# Patient Record
Sex: Female | Born: 1965 | Race: White | Hispanic: No | Marital: Single | Smoking: Never smoker
Health system: Southern US, Community
[De-identification: ages and names within clinical notes are randomized; demographics above are authoritative.]

## PROBLEM LIST (undated history)

## (undated) DIAGNOSIS — J302 Other seasonal allergic rhinitis: Secondary | ICD-10-CM

## (undated) HISTORY — PX: BREAST CYST ASPIRATION: SHX578

---

## 2005-06-07 ENCOUNTER — Ambulatory Visit: Payer: Self-pay | Admitting: Internal Medicine

## 2005-06-09 ENCOUNTER — Ambulatory Visit: Payer: Self-pay | Admitting: Internal Medicine

## 2006-03-30 ENCOUNTER — Ambulatory Visit: Payer: Self-pay | Admitting: Unknown Physician Specialty

## 2007-07-17 ENCOUNTER — Ambulatory Visit: Payer: Self-pay | Admitting: Unknown Physician Specialty

## 2007-07-26 ENCOUNTER — Ambulatory Visit: Payer: Self-pay | Admitting: Unknown Physician Specialty

## 2008-10-28 ENCOUNTER — Ambulatory Visit: Payer: Self-pay

## 2010-01-20 ENCOUNTER — Ambulatory Visit: Payer: Self-pay

## 2011-08-22 ENCOUNTER — Ambulatory Visit: Payer: Self-pay | Admitting: Family Medicine

## 2011-12-05 ENCOUNTER — Ambulatory Visit: Payer: Self-pay | Admitting: Obstetrics and Gynecology

## 2011-12-09 ENCOUNTER — Ambulatory Visit: Payer: Self-pay | Admitting: Obstetrics and Gynecology

## 2012-10-17 ENCOUNTER — Ambulatory Visit: Payer: Self-pay | Admitting: Nurse Practitioner

## 2013-03-11 ENCOUNTER — Ambulatory Visit: Payer: Self-pay | Admitting: Physician Assistant

## 2013-10-18 ENCOUNTER — Ambulatory Visit: Payer: Self-pay | Admitting: Nurse Practitioner

## 2014-11-18 ENCOUNTER — Ambulatory Visit: Payer: Self-pay | Admitting: Nurse Practitioner

## 2015-10-27 ENCOUNTER — Other Ambulatory Visit: Payer: Self-pay | Admitting: Nurse Practitioner

## 2015-10-27 DIAGNOSIS — Z1231 Encounter for screening mammogram for malignant neoplasm of breast: Secondary | ICD-10-CM

## 2015-11-26 ENCOUNTER — Ambulatory Visit
Admission: RE | Admit: 2015-11-26 | Discharge: 2015-11-26 | Disposition: A | Discharge status: Home / Self Care | Payer: Managed Care, Other (non HMO) | Source: Ambulatory Visit | Attending: Nurse Practitioner | Admitting: Nurse Practitioner

## 2015-11-26 DIAGNOSIS — Z1231 Encounter for screening mammogram for malignant neoplasm of breast: Secondary | ICD-10-CM | POA: Diagnosis not present

## 2015-11-30 ENCOUNTER — Other Ambulatory Visit: Payer: Self-pay | Admitting: Nurse Practitioner

## 2015-11-30 DIAGNOSIS — N63 Unspecified lump in unspecified breast: Secondary | ICD-10-CM

## 2015-12-04 ENCOUNTER — Ambulatory Visit
Admission: RE | Admit: 2015-12-04 | Discharge: 2015-12-04 | Disposition: A | Discharge status: Home / Self Care | Payer: Managed Care, Other (non HMO) | Source: Ambulatory Visit | Attending: Nurse Practitioner | Admitting: Nurse Practitioner

## 2015-12-04 ENCOUNTER — Ambulatory Visit: Payer: Managed Care, Other (non HMO)

## 2015-12-04 DIAGNOSIS — N6002 Solitary cyst of left breast: Secondary | ICD-10-CM | POA: Diagnosis not present

## 2015-12-04 DIAGNOSIS — N63 Unspecified lump in unspecified breast: Secondary | ICD-10-CM

## 2016-05-22 ENCOUNTER — Encounter: Payer: Self-pay | Admitting: Emergency Medicine

## 2016-05-22 ENCOUNTER — Ambulatory Visit
Admission: EM | Admit: 2016-05-22 | Discharge: 2016-05-22 | Disposition: A | Discharge status: Home / Self Care | Payer: Managed Care, Other (non HMO) | Attending: Family Medicine | Admitting: Family Medicine

## 2016-05-22 DIAGNOSIS — J309 Allergic rhinitis, unspecified: Secondary | ICD-10-CM | POA: Diagnosis not present

## 2016-05-22 HISTORY — DX: Other seasonal allergic rhinitis: J30.2

## 2016-05-22 MED ORDER — GUAIFENESIN-CODEINE 100-10 MG/5ML PO SOLN: 5.0000 mL | Freq: Every evening | ORAL | Status: DC | PRN

## 2016-05-22 MED ORDER — BENZONATATE 100 MG PO CAPS: 100.0000 mg | ORAL_CAPSULE | Freq: Three times a day (TID) | ORAL | Status: DC | PRN

## 2016-05-22 NOTE — ED Notes (Signed)
Patient c/o sinus congestion and pressure, HA, and nasal congestion that started yesterday.  Patient denies fevers.

## 2016-05-22 NOTE — ED Provider Notes (Signed)
[facility]  ____________________________________________  Time seen: Approximately 11:30 AM  I have reviewed the triage vital signs and the nursing notes.   HISTORY  Chief Complaint Facial Pain and Nasal Congestion  HPI Angela Lee is a 50 y.o. femalepresents for the complaints of 1 day of runny nose, nasal congestion, sinus pressure and postnasal drainage that started yesterday morning. Patient reports frequently blowing her nose and then get and greenish drainage out. Patient reports that she does have a history of seasonal allergies and states that she normally takes Zyrtec and Flonase. Patient states that she always uses her Flonase but doesn't always use her Zyrtec. Patient states 2 days prior to her symptoms she was outside a lot and forgot to use her Flonase and Zyrtec. Patient states she does have a history of seasonal allergies and states that she feels that this flared the sickness up. Patient states that she is concerned she has a sinus infection. Reports has used Zyrtec and Flonase but denies any other additional medications pain use for her symptpms. Patient reports that she can feel postnasal drainage that causes a cough. Patient reports that the cough intermittently woke her up from sleep last night. Patient states occasional scratchy throat but denies sore throat at this time.   Reports continues to eat and drink well. Denies any fevers. Denies chest pain, shortness of breath, abdominal pain, dysuria, neck pain, back pain, extremity pain or extremity swelling.  No LMP recorded. Patient has had an ablation.   Past Medical History  Diagnosis Date  . Seasonal allergies     There are no active problems to display for this patient.   Past Surgical History  Procedure Laterality Date  . Breast cyst aspiration Bilateral     Current Outpatient Rx  Name  Route  Sig  Dispense  Refill  . fluticasone (FLONASE) 50 MCG/ACT nasal spray   Each Nare   Place 1  spray into both nostrils daily.         . Multiple Vitamin (MULTIVITAMIN) tablet   Oral   Take 1 tablet by mouth daily.           Allergies Depo-provera; Imitrex; Naprosyn; Propranolol; and Phentermine  Family History  Problem Relation Age of Onset  . Lung cancer Mother   . Leukemia Father   . Breast cancer Maternal Aunt     Social History Social History  Substance Use Topics  . Smoking status: Never Smoker   . Smokeless tobacco: None  . Alcohol Use: Yes    Review of Systems Constitutional: No fever/chills Eyes: No visual changes. ENT: Positive runny nose, nasal congestion.  Cardiovascular: Denies chest pain. Respiratory: Denies shortness of breath. Gastrointestinal: No abdominal pain.  No nausea, no vomiting.  No diarrhea.  No constipation. Genitourinary: Negative for dysuria. Musculoskeletal: Negative for back pain. Skin: Negative for rash. Neurological: Negative for headaches, focal weakness or numbness.  10-point ROS otherwise negative.  ____________________________________________  PHYSICAL EXAM:  VITAL SIGNS: ED Triage Vitals  Enc Vitals Group     BP 05/22/16 1111 133/86 mmHg     Pulse Rate 05/22/16 1111 78     Resp 05/22/16 1111 16     Temp 05/22/16 1111 98.6 F (37 C)     Temp Source 05/22/16 1111 Tympanic     SpO2 05/22/16 1111 99 %     Weight 05/22/16 1111 146 lb (66.225 kg)     Height 05/22/16 1111 5\' 3"  (1.6 m)     Head  Cir --      Peak Flow --      Pain Score 05/22/16 1117 4     Pain Loc --      Pain Edu? --      Excl. in GC? --    Constitutional: Alert and oriented. Well appearing and in no acute distress. Eyes: Conjunctivae are normal. PERRL. EOMI. Head: Atraumatic. Mild sinus tenderness to palpation. No swelling. No erythema.  Ears: no erythema, normal TMs bilaterally.   Nose:Nasal congestion with clear rhinorrhea  Mouth/Throat: Mucous membranes are moist. No pharyngeal erythema. No tonsillar swelling or exudate.  Neck: No  stridor.  No cervical spine tenderness to palpation. Hematological/Lymphatic/Immunilogical: No cervical lymphadenopathy. Cardiovascular: Normal rate, regular rhythm. Grossly normal heart sounds.  Good peripheral circulation. Respiratory: Normal respiratory effort.  No retractions. Lungs CTAB. No wheezes, rales or rhonchi. Good air movement.  Gastrointestinal: Soft and nontender. Normal Bowel sounds. No CVA tenderness. Musculoskeletal: No lower or upper extremity tenderness nor edema. No cervical, thoracic or lumbar tenderness to palpation. Neurologic:  Normal speech and language. No gross focal neurologic deficits are appreciated. No gait instability. Skin:  Skin is warm, dry and intact. No rash noted. Psychiatric: Mood and affect are normal. Speech and behavior are normal.  ____________________________________________   LABS (all labs ordered are listed, but only abnormal results are displayed)  Labs Reviewed - No data to display  RADIOLOGY  No results found.   INITIAL IMPRESSION / ASSESSMENT AND PLAN / ED COURSE  Pertinent labs & imaging results that were available during my care of the patient were reviewed by me and considered in my medical decision making (see chart for details).  Very well-appearing patient. No acute distress. Presents for the complaints of runny nose, nasal congestion, sinus pressure since yesterday. Reports history of seasonal allergies and was concerned for sinus infection. Well-appearing patient. Lungs clear throughout. Abdomen soft and nontender. Very mild bilateral frontal and maxillary sinus tenderness to palpation with clear nasal congestion. Patient declines strep testing. Suspect allergic rhinitis versus upper respiratory infection. Discussed with patient will treat supportively. Continue home Flonase and Zyrtec and will treat cough with when necessary Tessalon Perles during the day and when necessary guaifenesin with codeine at night as patient reports  that she has tolerated guaifenesin with codeine in the past. Encouraged rest, fluids, avoidance of triggers.Discussed indication, risks and benefits of medications with patient.  Discussed follow up with Primary care physician this week. Discussed follow up and return parameters including no resolution or any worsening concerns. Patient verbalized understanding and agreed to plan.   ____________________________________________   FINAL CLINICAL IMPRESSION(S) / ED DIAGNOSES  Final diagnoses:  Allergic rhinitis, unspecified allergic rhinitis type     Discharge Medication List as of 05/22/2016 11:41 AM    START taking these medications   Details  benzonatate (TESSALON PERLES) 100 MG capsule Take 1 capsule (100 mg total) by mouth 3 (three) times daily as needed for cough., Starting 05/22/2016, Until Discontinued, Print    guaiFENesin-codeine 100-10 MG/5ML syrup Take 5 mLs by mouth at bedtime as needed for cough (do not drive or operate machinery while taking as can cause drowsiness.)., Starting 05/22/2016, Until Discontinued, Print        Note: This dictation was prepared with Dragon dictation along with smaller phrase technology. Any transcriptional errors that result from this process are unintentional.      Renford Dills, NP 05/22/16 1913

## 2016-05-22 NOTE — Discharge Instructions (Signed)
Take medication as prescribed. Rest. Continue home allergic medication and flonase. Avoid triggers.   Follow up with your primary care physician this week as needed. Return to Urgent care for new or worsening concerns.    Allergic Rhinitis Allergic rhinitis is when the mucous membranes in the nose respond to allergens. Allergens are particles in the air that cause your body to have an allergic reaction. This causes you to release allergic antibodies. Through a chain of events, these eventually cause you to release histamine into the blood stream. Although meant to protect the body, it is this release of histamine that causes your discomfort, such as frequent sneezing, congestion, and an itchy, runny nose.  CAUSES Seasonal allergic rhinitis (hay fever) is caused by pollen allergens that may come from grasses, trees, and weeds. Year-round allergic rhinitis (perennial allergic rhinitis) is caused by allergens such as house dust mites, pet dander, and mold spores. SYMPTOMS  Nasal stuffiness (congestion).  Itchy, runny nose with sneezing and tearing of the eyes. DIAGNOSIS Your health care provider can help you determine the allergen or allergens that trigger your symptoms. If you and your health care provider are unable to determine the allergen, skin or blood testing may be used. Your health care provider will diagnose your condition after taking your health history and performing a physical exam. Your health care provider may assess you for other related conditions, such as asthma, pink eye, or an ear infection. TREATMENT Allergic rhinitis does not have a cure, but it can be controlled by:  Medicines that block allergy symptoms. These may include allergy shots, nasal sprays, and oral antihistamines.  Avoiding the allergen. Hay fever may often be treated with antihistamines in pill or nasal spray forms. Antihistamines block the effects of histamine. There are over-the-counter medicines that may  help with nasal congestion and swelling around the eyes. Check with your health care provider before taking or giving this medicine. If avoiding the allergen or the medicine prescribed do not work, there are many new medicines your health care provider can prescribe. Stronger medicine may be used if initial measures are ineffective. Desensitizing injections can be used if medicine and avoidance does not work. Desensitization is when a patient is given ongoing shots until the body becomes less sensitive to the allergen. Make sure you follow up with your health care provider if problems continue. HOME CARE INSTRUCTIONS It is not possible to completely avoid allergens, but you can reduce your symptoms by taking steps to limit your exposure to them. It helps to know exactly what you are allergic to so that you can avoid your specific triggers. SEEK MEDICAL CARE IF:  You have a fever.  You develop a cough that does not stop easily (persistent).  You have shortness of breath.  You start wheezing.  Symptoms interfere with normal daily activities.   This information is not intended to replace advice given to you by your health care provider. Make sure you discuss any questions you have with your health care provider.   Document Released: 09/06/2001 Document Revised: 01/02/2015 Document Reviewed: 08/19/2013 Elsevier Interactive Patient Education [employer]2016 [employer] Inc.

## 2016-11-24 ENCOUNTER — Other Ambulatory Visit: Payer: Self-pay | Admitting: Nurse Practitioner

## 2016-11-24 DIAGNOSIS — Z1231 Encounter for screening mammogram for malignant neoplasm of breast: Secondary | ICD-10-CM

## 2016-12-30 ENCOUNTER — Ambulatory Visit: Payer: Managed Care, Other (non HMO) | Attending: Nurse Practitioner

## 2017-01-30 ENCOUNTER — Ambulatory Visit
Admission: RE | Admit: 2017-01-30 | Discharge: 2017-01-30 | Disposition: A | Discharge status: Home / Self Care | Payer: Managed Care, Other (non HMO) | Source: Ambulatory Visit | Attending: Nurse Practitioner | Admitting: Nurse Practitioner

## 2017-01-30 DIAGNOSIS — Z1231 Encounter for screening mammogram for malignant neoplasm of breast: Secondary | ICD-10-CM | POA: Insufficient documentation

## 2018-01-16 ENCOUNTER — Other Ambulatory Visit: Payer: Self-pay | Admitting: Nurse Practitioner

## 2018-01-16 DIAGNOSIS — Z1231 Encounter for screening mammogram for malignant neoplasm of breast: Secondary | ICD-10-CM

## 2018-01-31 ENCOUNTER — Ambulatory Visit
Admission: RE | Admit: 2018-01-31 | Discharge: 2018-01-31 | Disposition: A | Discharge status: Home / Self Care | Payer: Managed Care, Other (non HMO) | Source: Ambulatory Visit | Attending: Nurse Practitioner | Admitting: Nurse Practitioner

## 2018-01-31 DIAGNOSIS — Z1231 Encounter for screening mammogram for malignant neoplasm of breast: Secondary | ICD-10-CM | POA: Insufficient documentation

## 2018-01-31 DIAGNOSIS — N6489 Other specified disorders of breast: Secondary | ICD-10-CM | POA: Diagnosis not present

## 2018-01-31 DIAGNOSIS — R928 Other abnormal and inconclusive findings on diagnostic imaging of breast: Secondary | ICD-10-CM | POA: Insufficient documentation

## 2018-02-02 ENCOUNTER — Other Ambulatory Visit: Payer: Self-pay | Admitting: Nurse Practitioner

## 2018-02-02 DIAGNOSIS — R928 Other abnormal and inconclusive findings on diagnostic imaging of breast: Secondary | ICD-10-CM

## 2018-02-02 DIAGNOSIS — N6489 Other specified disorders of breast: Secondary | ICD-10-CM

## 2018-02-07 ENCOUNTER — Ambulatory Visit
Admission: RE | Admit: 2018-02-07 | Discharge: 2018-02-07 | Disposition: A | Discharge status: Home / Self Care | Payer: Managed Care, Other (non HMO) | Source: Ambulatory Visit | Attending: Nurse Practitioner | Admitting: Nurse Practitioner

## 2018-02-07 DIAGNOSIS — N6489 Other specified disorders of breast: Secondary | ICD-10-CM

## 2018-02-07 DIAGNOSIS — R928 Other abnormal and inconclusive findings on diagnostic imaging of breast: Secondary | ICD-10-CM | POA: Diagnosis not present

## 2018-02-07 DIAGNOSIS — N6001 Solitary cyst of right breast: Secondary | ICD-10-CM | POA: Diagnosis not present

## 2018-11-05 ENCOUNTER — Emergency Department: Payer: Managed Care, Other (non HMO)

## 2018-11-05 ENCOUNTER — Encounter: Payer: Self-pay | Admitting: Emergency Medicine

## 2018-11-05 ENCOUNTER — Other Ambulatory Visit: Payer: Self-pay

## 2018-11-05 ENCOUNTER — Emergency Department
Admission: EM | Admit: 2018-11-05 | Discharge: 2018-11-05 | Disposition: A | Discharge status: Home / Self Care | Payer: Managed Care, Other (non HMO) | Attending: Emergency Medicine | Admitting: Emergency Medicine

## 2018-11-05 DIAGNOSIS — Y998 Other external cause status: Secondary | ICD-10-CM | POA: Insufficient documentation

## 2018-11-05 DIAGNOSIS — Y9389 Activity, other specified: Secondary | ICD-10-CM | POA: Insufficient documentation

## 2018-11-05 DIAGNOSIS — R42 Dizziness and giddiness: Secondary | ICD-10-CM | POA: Insufficient documentation

## 2018-11-05 DIAGNOSIS — S20312A Abrasion of left front wall of thorax, initial encounter: Secondary | ICD-10-CM | POA: Diagnosis not present

## 2018-11-05 DIAGNOSIS — S0990XA Unspecified injury of head, initial encounter: Secondary | ICD-10-CM | POA: Diagnosis present

## 2018-11-05 DIAGNOSIS — Y929 Unspecified place or not applicable: Secondary | ICD-10-CM | POA: Insufficient documentation

## 2018-11-05 DIAGNOSIS — S161XXA Strain of muscle, fascia and tendon at neck level, initial encounter: Secondary | ICD-10-CM | POA: Insufficient documentation

## 2018-11-05 DIAGNOSIS — S0003XA Contusion of scalp, initial encounter: Secondary | ICD-10-CM

## 2018-11-05 DIAGNOSIS — S60222A Contusion of left hand, initial encounter: Secondary | ICD-10-CM | POA: Diagnosis not present

## 2018-11-05 DIAGNOSIS — Z79899 Other long term (current) drug therapy: Secondary | ICD-10-CM | POA: Insufficient documentation

## 2018-11-05 MED ORDER — HYDROCODONE-ACETAMINOPHEN 5-325 MG PO TABS: 1.0000 | ORAL_TABLET | ORAL | 0 refills | Status: DC | PRN

## 2018-11-05 MED ORDER — HYDROCODONE-ACETAMINOPHEN 5-325 MG PO TABS
1.0000 | ORAL_TABLET | Freq: Once | ORAL | Status: AC
  Administered 2018-11-05: 1 via ORAL
  Filled 2018-11-05: qty 1

## 2018-11-05 MED ORDER — METHOCARBAMOL 500 MG PO TABS: 500.0000 mg | ORAL_TABLET | Freq: Four times a day (QID) | ORAL | 0 refills | Status: DC | PRN

## 2018-11-05 MED ORDER — HYDROCODONE-ACETAMINOPHEN 5-325 MG PO TABS: 1.0000 | ORAL_TABLET | Freq: Four times a day (QID) | ORAL | 0 refills | Status: DC | PRN
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
## 2018-11-05 NOTE — Discharge Instructions (Addendum)
Follow-up with your primary care provider or [facility] acute care if any continued problems.  Return to the emergency department if any severe worsening of your symptoms.  Take methocarbamol 500 mg every 6 hours as needed for muscle spasms.  Norco as needed for pain.  You may continue taking ibuprofen as needed for pain and inflammation.  Ice to your hand as needed for swelling.  Be aware that you will be more sore tomorrow than you are currently.

## 2019-01-17 ENCOUNTER — Other Ambulatory Visit: Payer: Self-pay | Admitting: Nurse Practitioner

## 2019-01-17 DIAGNOSIS — Z1231 Encounter for screening mammogram for malignant neoplasm of breast: Secondary | ICD-10-CM
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
## 2019-02-21 ENCOUNTER — Ambulatory Visit
Admission: RE | Admit: 2019-02-21 | Discharge: 2019-02-21 | Disposition: A | Discharge status: Home / Self Care | Payer: Managed Care, Other (non HMO) | Source: Ambulatory Visit | Attending: Nurse Practitioner | Admitting: Nurse Practitioner

## 2019-02-21 DIAGNOSIS — Z1231 Encounter for screening mammogram for malignant neoplasm of breast: Secondary | ICD-10-CM | POA: Insufficient documentation

## 2019-02-25 ENCOUNTER — Other Ambulatory Visit: Payer: Self-pay | Admitting: Nurse Practitioner

## 2019-02-25 DIAGNOSIS — R928 Other abnormal and inconclusive findings on diagnostic imaging of breast: Secondary | ICD-10-CM

## 2019-02-25 DIAGNOSIS — N6320 Unspecified lump in the left breast, unspecified quadrant: Secondary | ICD-10-CM

## 2019-03-01 ENCOUNTER — Ambulatory Visit
Admission: RE | Admit: 2019-03-01 | Discharge: 2019-03-01 | Disposition: A | Discharge status: Home / Self Care | Payer: Managed Care, Other (non HMO) | Source: Ambulatory Visit | Attending: Nurse Practitioner | Admitting: Nurse Practitioner

## 2019-03-01 DIAGNOSIS — R928 Other abnormal and inconclusive findings on diagnostic imaging of breast: Secondary | ICD-10-CM | POA: Diagnosis not present

## 2019-03-01 DIAGNOSIS — N6320 Unspecified lump in the left breast, unspecified quadrant: Secondary | ICD-10-CM

## 2019-05-29 IMAGING — MG DIGITAL DIAGNOSTIC UNILATERAL LEFT MAMMOGRAM WITH TOMO AND CAD
4 series · 4 of 12 positions shown · non-contrast
Comparison: Previous exam(s).

CLINICAL DATA: The patient was called back for left breast masses.

EXAM:
DIGITAL DIAGNOSTIC LEFT MAMMOGRAM WITH TOMO
ULTRASOUND LEFT BREAST

[L MLO synth-2D]
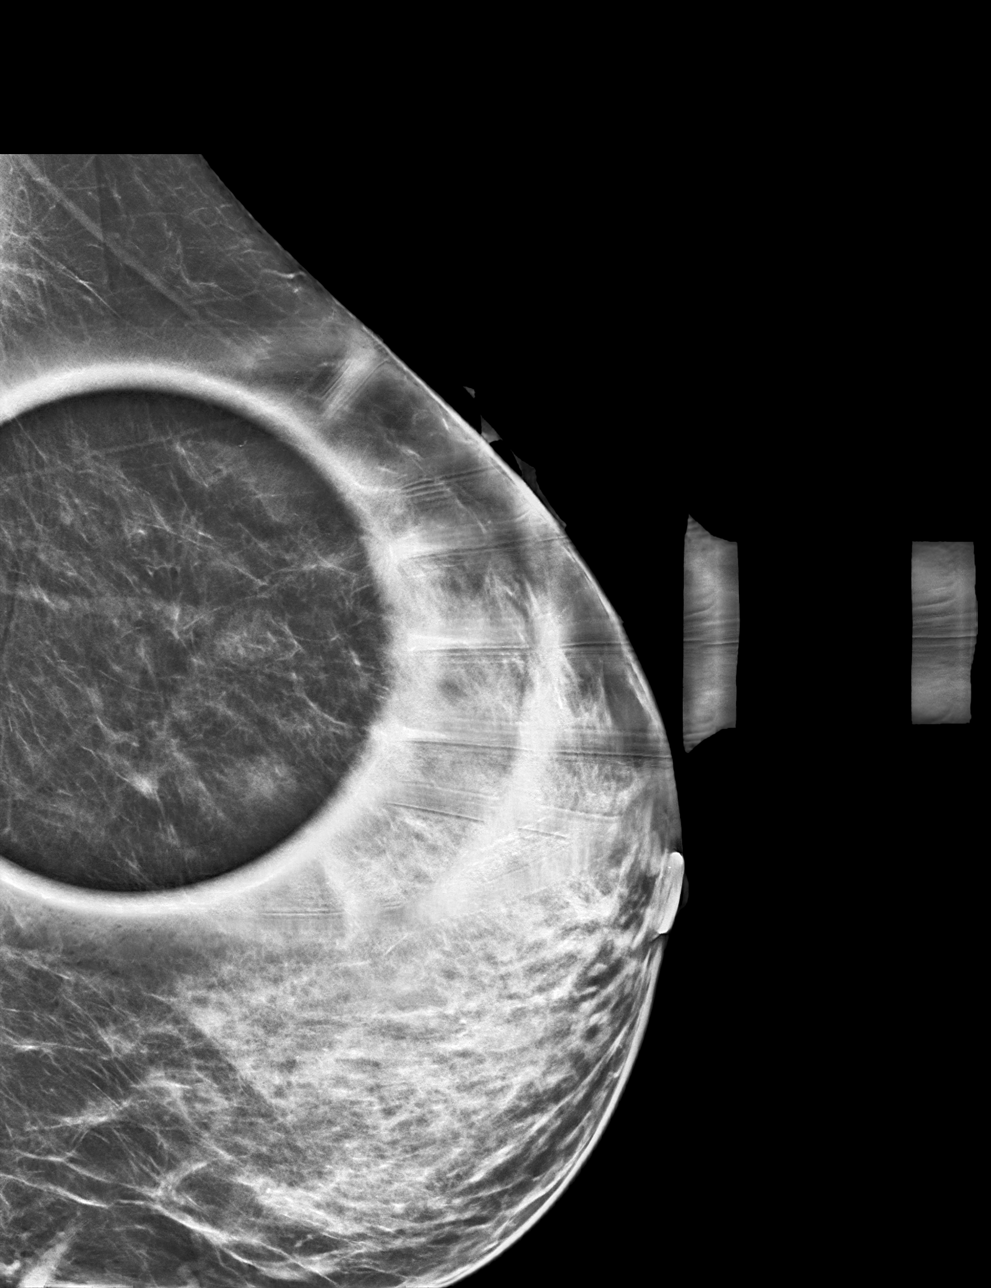

[L CC synth-2D]
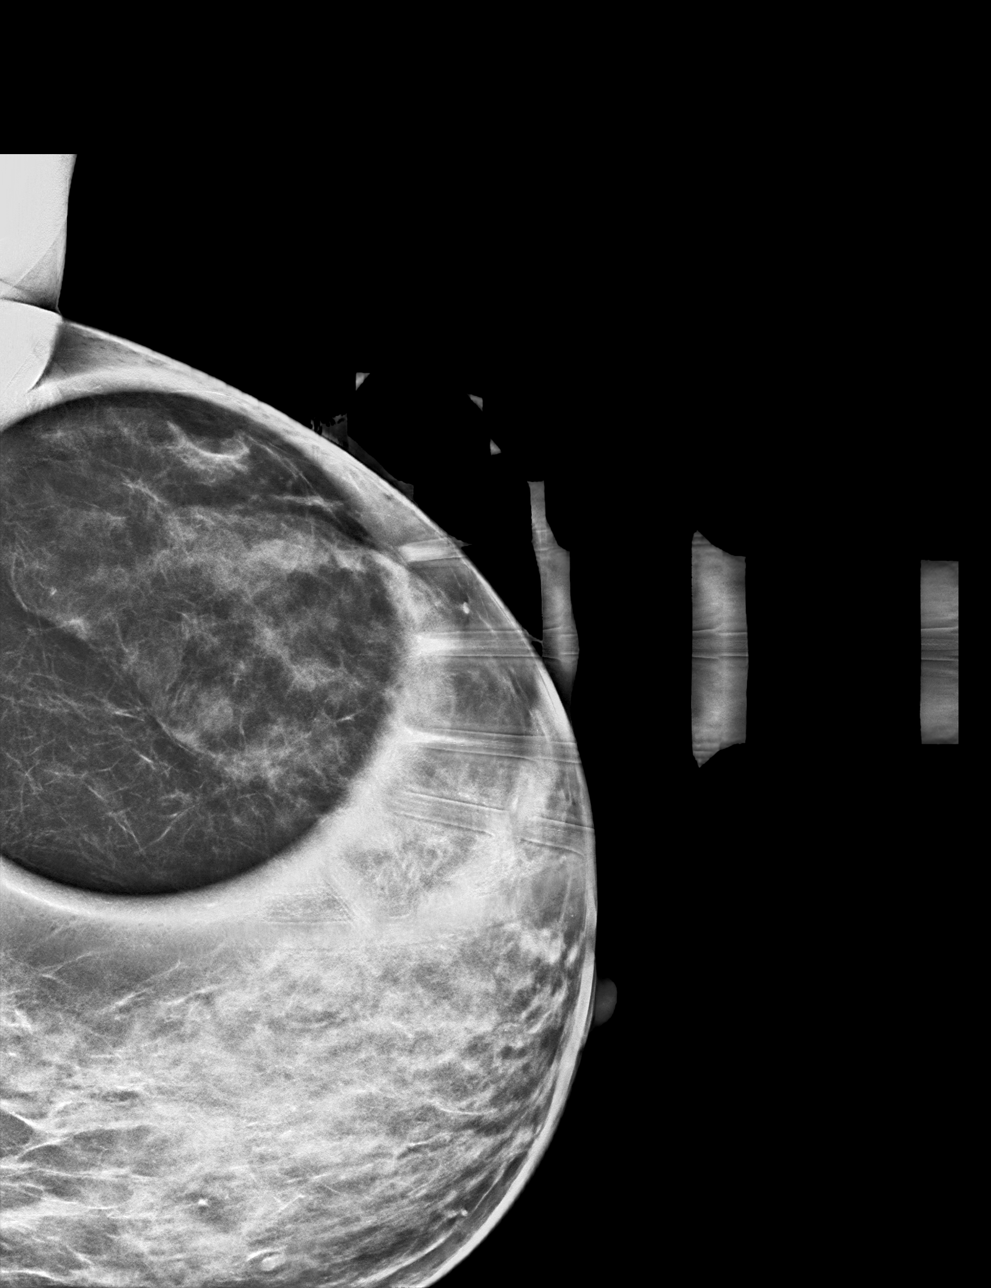

[L CC tomo · tomo slice 29/57.0]
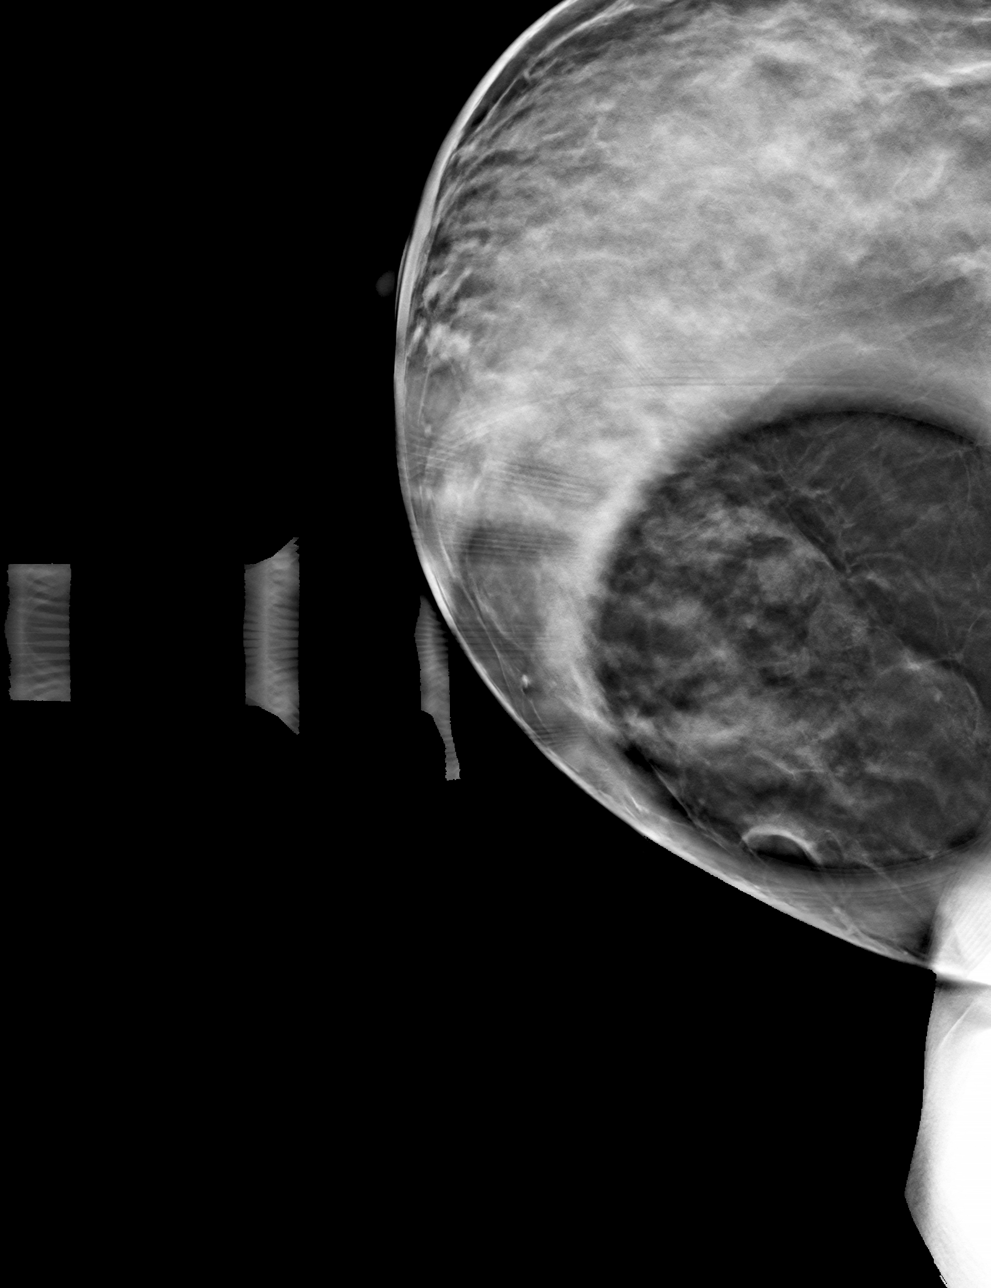

[L MLO tomo · tomo slice 27/54.0]
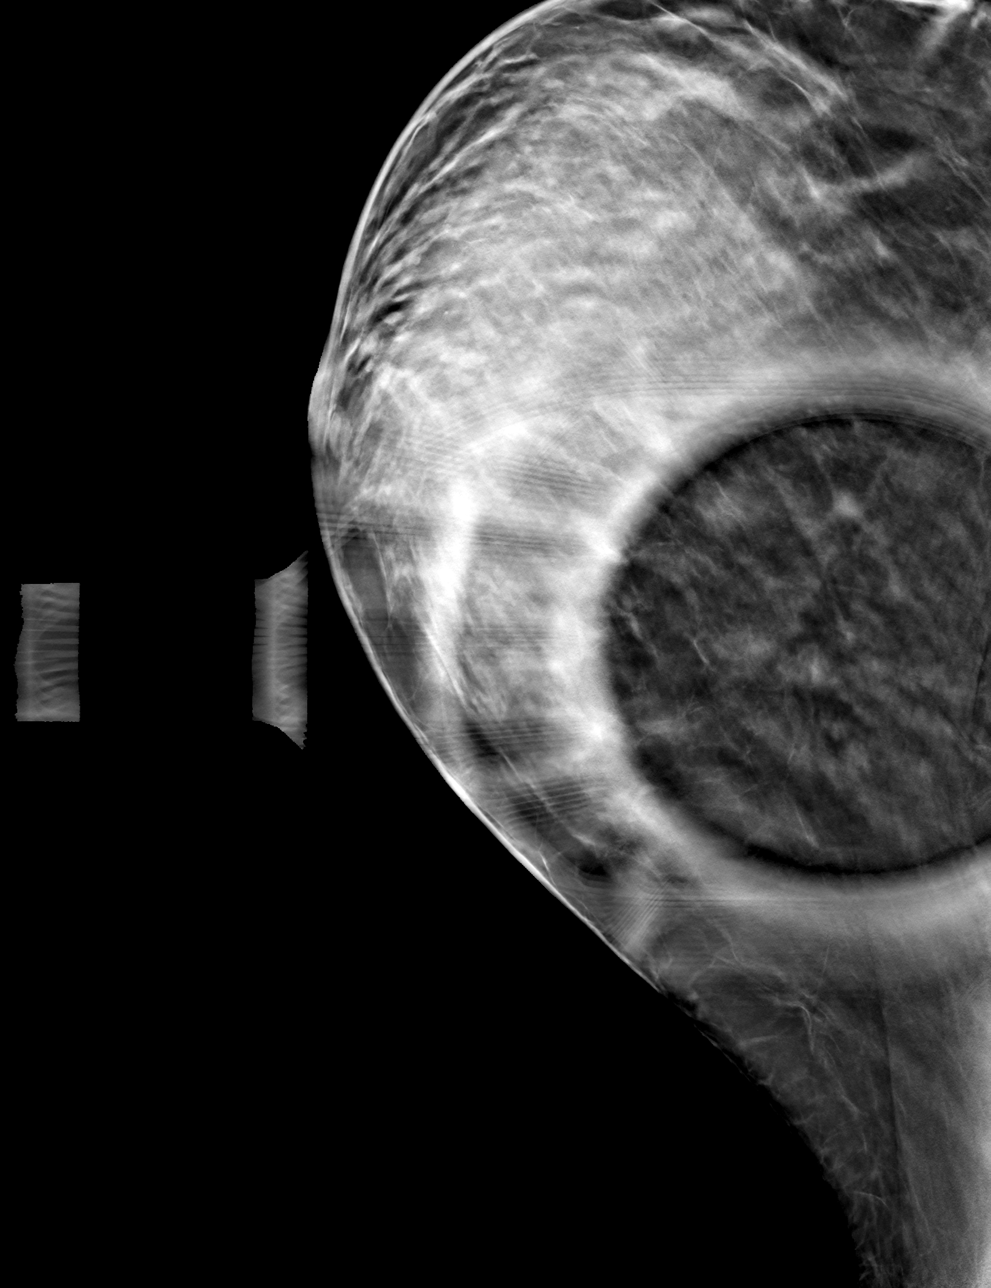

[4 of 12 positions shown; findings below may reference images not displayed]

ACR Breast Density Category c: The breast tissue is heterogeneously
dense, which may obscure small masses.
FINDINGS: There are numerous left breast masses in the lateral left breast.

On physical exam, no suspicious lumps are identified.

Targeted ultrasound is performed, showing numerous cysts between 2
o'clock and 4 o'clock accounting for the mammographic findings.
IMPRESSION: Fibrocystic changes.  No evidence of malignancy.

RECOMMENDATION:
Annual screening mammography.

I have discussed the findings and recommendations with the patient.
Results were also provided in writing at the conclusion of the
visit. If applicable, a reminder letter will be sent to the patient
regarding the next appointment.

BI-RADS CATEGORY  2: Benign.

## 2020-01-20 ENCOUNTER — Other Ambulatory Visit: Payer: Self-pay | Admitting: Nurse Practitioner

## 2020-01-20 DIAGNOSIS — Z1231 Encounter for screening mammogram for malignant neoplasm of breast: Secondary | ICD-10-CM

## 2020-06-03 ENCOUNTER — Encounter: Payer: Self-pay | Admitting: Emergency Medicine

## 2020-06-03 ENCOUNTER — Ambulatory Visit
Admission: EM | Admit: 2020-06-03 | Discharge: 2020-06-03 | Disposition: A | Discharge status: Home / Self Care | Payer: 59 | Attending: Family Medicine | Admitting: Family Medicine

## 2020-06-03 DIAGNOSIS — T148XXA Other injury of unspecified body region, initial encounter: Secondary | ICD-10-CM

## 2020-06-03 MED ORDER — MELOXICAM 15 MG PO TABS: 15.0000 mg | ORAL_TABLET | Freq: Every day | ORAL | 0 refills | Status: AC | PRN

## 2020-06-03 NOTE — ED Provider Notes (Signed)
[facility]    CSN: [id number] Arrival date & time: 06/03/20  1631      History   Chief Complaint Chief Complaint  Patient presents with  . Muscle Pain   HPI  54 year old female presents with the above complaint.  Patient states that approximately 1.5 to 2 weeks ago she was at a pole dancing class.  She states that she believes she pulled a muscle on the left side of her chest.  She states that she has not yet improved fully.  Rates her pain as 2/10 in severity.  She has been using ice and heat.  She states that she has some pain particular with certain activities.  She also reports some associated upper back pain.  No other reported injuries.  Denies bruising.  No other complaints.  Past Medical History:  Diagnosis Date  . Seasonal allergies    Past Surgical History:  Procedure Laterality Date  . BREAST CYST ASPIRATION Bilateral     OB History   No obstetric history on file.      Home Medications    Prior to Admission medications   Medication Sig Start Date End Date Taking? Authorizing Provider  fluticasone (FLONASE) 50 MCG/ACT nasal spray Place 1 spray into both nostrils daily.   Yes [provider]  meloxicam (MOBIC) 15 MG tablet Take 1 tablet (15 mg total) by mouth daily as needed for pain. 06/03/20   Coral Spikes, DO    Family History Family History  Problem Relation Age of Onset  . Lung cancer Mother   . Leukemia Father   . Breast cancer Maternal Aunt     Social History Social History   Tobacco Use  . Smoking status: Never Smoker  . Smokeless tobacco: Never Used  Substance Use Topics  . Alcohol use: Yes  . Drug use: Never     Allergies   Depo-provera [medroxyprogesterone], Imitrex [sumatriptan], Naprosyn [naproxen], Propranolol, and Phentermine   Review of Systems Review of Systems  Constitutional: Negative.   Musculoskeletal:       Left chest wall pain.   Physical Exam Triage Vital Signs ED Triage Vitals  Enc  Vitals Group     BP 06/03/20 1653 (!) 154/98     Pulse Rate 06/03/20 1653 70     Resp 06/03/20 1653 18     Temp 06/03/20 1653 98.3 F (36.8 C)     Temp Source 06/03/20 1653 Oral     SpO2 06/03/20 1653 100 %     Weight 06/03/20 1649 140 lb (63.5 kg)     Height 06/03/20 1649 5\' 3"  (1.6 m)     Head Circumference --      Peak Flow --      Pain Score 06/03/20 1649 2     Pain Loc --      Pain Edu? --      Excl. in [place]? --    Updated Vital Signs BP (!) 154/98 (BP Location: Right Arm)   Pulse 70   Temp 98.3 F (36.8 C) (Oral)   Resp 18   Ht 5\' 3"  (1.6 m)   Wt 63.5 kg   SpO2 100%   BMI 24.80 kg/m   Visual Acuity Right Eye Distance:   Left Eye Distance:   Bilateral Distance:    Right Eye Near:   Left Eye Near:    Bilateral Near:     Physical Exam Vitals and nursing note reviewed.  Constitutional:  General: She is not in acute distress.    Appearance: Normal appearance. She is not ill-appearing.  HENT:     Head: Normocephalic and atraumatic.  Eyes:     General:        Right eye: No discharge.        Left eye: No discharge.     Conjunctiva/sclera: Conjunctivae normal.  Pulmonary:     Effort: Pulmonary effort is normal. No respiratory distress.  Chest:     Chest wall: No tenderness.  Musculoskeletal:     Comments: No discrete areas of tenderness in the left axilla or the left anterior chest wall.  Neurological:     Mental Status: She is alert.  Psychiatric:        Mood and Affect: Mood normal.        Behavior: Behavior normal.    UC Treatments / Results  Labs (all labs ordered are listed, but only abnormal results are displayed) Labs Reviewed - No data to display  EKG   Radiology No results found.  Procedures Procedures (including critical care time)  Medications Ordered in UC Medications - No data to display  Initial Impression / Assessment and Plan / UC Course  I have reviewed the triage vital signs and the nursing notes.  Pertinent labs &  imaging results that were available during my care of the patient were reviewed by me and considered in my medical decision making (see chart for details).    54 year old female presents with muscle strain.  Advised rest.  Ice or heat as needed for symptomatic relief.  Meloxicam as directed.  Advised to avoid use of BC powder which she states that she uses frequently for headaches.    Final Clinical Impressions(s) / UC Diagnoses   Final diagnoses:  Muscle strain     Discharge Instructions     Rest.  Ice or heat.  Medication as directed.   Take care  Dr. Adriana Simas    ED Prescriptions    Medication Sig Dispense Auth. Provider   meloxicam (MOBIC) 15 MG tablet Take 1 tablet (15 mg total) by mouth daily as needed for pain. 14 tablet Tommie Sams, DO     PDMP not reviewed this encounter.   Tommie Sams, [place] 06/03/20 1714

## 2020-06-03 NOTE — ED Triage Notes (Signed)
Patient states she pulled her chest muscle on the left side while she was taking a pole dancing class about 2 weeks ago.

## 2020-06-03 NOTE — Discharge Instructions (Addendum)
Rest.  Ice or heat.  Medication as directed.   Take care  Dr. Adriana Simas

## 2020-08-10 ENCOUNTER — Other Ambulatory Visit: Payer: Self-pay

## 2020-08-10 ENCOUNTER — Ambulatory Visit
Admission: EM | Admit: 2020-08-10 | Discharge: 2020-08-10 | Disposition: A | Discharge status: Home / Self Care | Payer: Managed Care, Other (non HMO) | Attending: Internal Medicine | Admitting: Internal Medicine

## 2020-08-10 DIAGNOSIS — S43402A Unspecified sprain of left shoulder joint, initial encounter: Secondary | ICD-10-CM | POA: Diagnosis not present

## 2020-08-10 NOTE — ED Triage Notes (Signed)
Pt states she took a stretch class on Thursday. This morning her anterior left shoulder is bothering her and radiating into whole shoulder. Difficulty lifting past chest height.

## 2020-08-10 NOTE — Discharge Instructions (Signed)
Heating pad to the left shoulder-10 minutes on/15 minutes off for cycles twice daily Take NSAIDs Gentle range of motion exercises Resume workout next week Monday.

## 2020-08-10 NOTE — ED Provider Notes (Signed)
[facility]    CSN: [id number] Arrival date & time: 08/10/20  1646      History   Chief Complaint Chief Complaint  Patient presents with  . Shoulder Pain    HPI Angela Lee is a 54 y.o. female comes to the urgent care with left shoulder pain which started this morning.  Patient was in the stretch class last Thursday.  This morning patient experienced anterior left shoulder pain.  Pain is aggravated by movement of her left shoulder.   Pain is relieved when she keeps her shoulder still.  There is no swelling.  No radiation of pain. HPI  Past Medical History:  Diagnosis Date  . Seasonal allergies     There are no problems to display for this patient.   Past Surgical History:  Procedure Laterality Date  . BREAST CYST ASPIRATION Bilateral     OB History   No obstetric history on file.      Home Medications    Prior to Admission medications   Medication Sig Start Date End Date Taking? Authorizing Provider  fluticasone (FLONASE) 50 MCG/ACT nasal spray Place 1 spray into both nostrils daily.    [provider]  meloxicam (MOBIC) 15 MG tablet Take 1 tablet (15 mg total) by mouth daily as needed for pain. 06/03/20   Tommie Sams, DO    Family History Family History  Problem Relation Age of Onset  . Lung cancer Mother   . Leukemia Father   . Breast cancer Maternal Aunt     Social History Social History   Tobacco Use  . Smoking status: Never Smoker  . Smokeless tobacco: Never Used  Vaping Use  . Vaping Use: Never used  Substance Use Topics  . Alcohol use: Yes    Comment: occasional  . Drug use: Never     Allergies   Depo-provera [medroxyprogesterone], Imitrex [sumatriptan], Naprosyn [naproxen], Propranolol, and Phentermine   Review of Systems Review of Systems  Constitutional: Negative for activity change.  Musculoskeletal: Positive for arthralgias. Negative for gait problem, joint swelling, myalgias, neck pain and neck  stiffness.  Neurological: Negative.   Psychiatric/Behavioral: Negative for confusion and decreased concentration.     Physical Exam Triage Vital Signs ED Triage Vitals  Enc Vitals Group     BP 08/10/20 1713 (!) 154/95     Pulse Rate 08/10/20 1713 73     Resp 08/10/20 1713 16     Temp 08/10/20 1713 98.4 F (36.9 C)     Temp Source 08/10/20 1713 Oral     SpO2 08/10/20 1713 100 %     Weight 08/10/20 1712 143 lb (64.9 kg)     Height 08/10/20 1712 5\' 3"  (1.6 m)     Head Circumference --      Peak Flow --      Pain Score 08/10/20 1711 5     Pain Loc --      Pain Edu? --      Excl. in [place]? --    No data found.  Updated Vital Signs BP (!) 154/95 (BP Location: Left Arm)   Pulse 73   Temp 98.4 F (36.9 C) (Oral)   Resp 16   Ht 5\' 3"  (1.6 m)   Wt 64.9 kg   SpO2 100%   BMI 25.33 kg/m   Visual Acuity Right Eye Distance:   Left Eye Distance:   Bilateral Distance:    Right Eye Near:   Left Eye Near:  Bilateral Near:     Physical Exam Vitals and nursing note reviewed.  Constitutional:      General: She is not in acute distress.    Appearance: She is not ill-appearing.  Cardiovascular:     Rate and Rhythm: Normal rate and regular rhythm.  Musculoskeletal:        General: Tenderness present. No swelling or deformity.     Comments: Full range of motion with some pain on the anterior aspect of the left shoulder.  Tenderness to palpation in the anterior portion of the left shoulder.  Skin:    Capillary Refill: Capillary refill takes less than 2 seconds.  Neurological:     Mental Status: She is alert.      UC Treatments / Results  Labs (all labs ordered are listed, but only abnormal results are displayed) Labs Reviewed - No data to display  EKG   Radiology No results found.  Procedures Procedures (including critical care time)  Medications Ordered in UC Medications - No data to display  Initial Impression / Assessment and Plan / UC Course  I have  reviewed the triage vital signs and the nursing notes.  Pertinent labs & imaging results that were available during my care of the patient were reviewed by me and considered in my medical decision making (see chart for details).     1.  Left shoulder sprain: Gentle range of motion exercises Heating pad as directed Meloxicam 15 mg orally daily Stay away from stretching exercises for a week to allow your shoulder to heal Return precautions given Final Clinical Impressions(s) / UC Diagnoses   Final diagnoses:  Sprain of left shoulder, unspecified shoulder sprain type, initial encounter     Discharge Instructions     Heating pad to the left shoulder-10 minutes on/15 minutes off for cycles twice daily Take NSAIDs Gentle range of motion exercises Resume workout next week Monday.   ED Prescriptions    None     PDMP not reviewed this encounter.   Merrilee Jansky, MD 08/10/20 [phone]

## 2021-05-03 ENCOUNTER — Other Ambulatory Visit: Payer: Self-pay | Admitting: Family Medicine

## 2021-05-03 DIAGNOSIS — Z1231 Encounter for screening mammogram for malignant neoplasm of breast: Secondary | ICD-10-CM

## 2021-05-11 ENCOUNTER — Ambulatory Visit
Admission: RE | Admit: 2021-05-11 | Discharge: 2021-05-11 | Disposition: A | Discharge status: Home / Self Care | Payer: 59 | Source: Ambulatory Visit | Attending: Family Medicine | Admitting: Family Medicine

## 2021-05-11 ENCOUNTER — Other Ambulatory Visit: Payer: Self-pay

## 2021-05-11 DIAGNOSIS — Z1231 Encounter for screening mammogram for malignant neoplasm of breast: Secondary | ICD-10-CM | POA: Diagnosis not present

## 2022-05-04 ENCOUNTER — Other Ambulatory Visit: Payer: Self-pay | Admitting: Family Medicine

## 2022-05-04 DIAGNOSIS — Z1231 Encounter for screening mammogram for malignant neoplasm of breast: Secondary | ICD-10-CM

## 2022-06-01 ENCOUNTER — Ambulatory Visit
Admission: RE | Admit: 2022-06-01 | Discharge: 2022-06-01 | Disposition: A | Discharge status: Home / Self Care | Payer: 59 | Source: Ambulatory Visit | Attending: Family Medicine | Admitting: Family Medicine

## 2022-06-01 DIAGNOSIS — Z1231 Encounter for screening mammogram for malignant neoplasm of breast: Secondary | ICD-10-CM | POA: Insufficient documentation

## 2023-08-01 ENCOUNTER — Other Ambulatory Visit: Payer: Self-pay | Admitting: Family Medicine

## 2023-08-01 DIAGNOSIS — Z1231 Encounter for screening mammogram for malignant neoplasm of breast: Secondary | ICD-10-CM

## 2023-09-07 ENCOUNTER — Ambulatory Visit
Admission: RE | Admit: 2023-09-07 | Discharge: 2023-09-07 | Disposition: A | Discharge status: Home / Self Care | Payer: BC Managed Care – PPO | Source: Ambulatory Visit | Attending: Family Medicine | Admitting: Family Medicine

## 2023-09-07 DIAGNOSIS — Z1231 Encounter for screening mammogram for malignant neoplasm of breast: Secondary | ICD-10-CM | POA: Insufficient documentation

## 2024-02-08 ENCOUNTER — Other Ambulatory Visit: Payer: Self-pay | Admitting: Certified Nurse Midwife

## 2024-02-08 DIAGNOSIS — Z1231 Encounter for screening mammogram for malignant neoplasm of breast: Secondary | ICD-10-CM

## 2024-09-17 ENCOUNTER — Ambulatory Visit

## 2024-10-21 ENCOUNTER — Ambulatory Visit
Admission: RE | Admit: 2024-10-21 | Discharge: 2024-10-21 | Disposition: A | Discharge status: Home / Self Care | Payer: Self-pay | Source: Ambulatory Visit | Attending: Certified Nurse Midwife | Admitting: Certified Nurse Midwife

## 2024-10-21 DIAGNOSIS — Z1231 Encounter for screening mammogram for malignant neoplasm of breast: Secondary | ICD-10-CM | POA: Diagnosis present
